# Patient Record
Sex: Male | Born: 2000 | Race: Black or African American | Hispanic: No | Marital: Single
Health system: Southern US, Community
[De-identification: ages and names within clinical notes are randomized; demographics above are authoritative.]

## PROBLEM LIST (undated history)

## (undated) DIAGNOSIS — F909 Attention-deficit hyperactivity disorder, unspecified type: Secondary | ICD-10-CM

## (undated) DIAGNOSIS — F840 Autistic disorder: Secondary | ICD-10-CM

---
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
## 2022-02-28 ENCOUNTER — Ambulatory Visit (INDEPENDENT_AMBULATORY_CARE_PROVIDER_SITE_OTHER): Payer: Medicaid Other | Admitting: Podiatry

## 2022-02-28 DIAGNOSIS — L600 Ingrowing nail: Secondary | ICD-10-CM

## 2022-02-28 MED ORDER — NEOMYCIN-POLYMYXIN-HC 1 % OT SOLN: OTIC | 0 refills | Status: AC

## 2022-02-28 NOTE — Patient Instructions (Signed)

## 2022-03-04 ENCOUNTER — Encounter: Payer: Self-pay | Admitting: Podiatry

## 2022-03-04 NOTE — Progress Notes (Signed)
?  Subjective:  ?Patient ID: Tom Foster, male    DOB: 06-09-2001,  MRN: [id number] ? ?Chief Complaint  ?Patient presents with  ? Ingrown Toenail  ?  Right great  ? ? ?21 y.o. male presents with the above complaint. History confirmed with patient.  Here with his mother who confirms a history ? ?Objective:  ?Physical Exam: ?warm, good capillary refill, no trophic changes or ulcerative lesions, normal DP and PT pulses, normal sensory exam, and ingrown right great toenail. ? ?Assessment:  ? ?1. Ingrowing right great toenail   ? ? ? ?Plan:  ?Patient was evaluated and treated and all questions answered. ? ? ? ?Ingrown Nail, right ?-Patient elects to proceed with minor surgery to remove ingrown toenail today. Consent reviewed and signed by patient. ?-Ingrown nail excised. See procedure note. ?-Educated on post-procedure care including soaking. Written instructions provided and reviewed. ?-Patient to follow up in 2 weeks for nail check. ? ?Procedure: Excision of Ingrown Toenail ?Location: Right 1st toe  nail borders. ?Anesthesia: Lidocaine 1% plain; 1.5 mL and Marcaine 0.5% plain; 1.5 mL, digital block. ?Skin Prep: Betadine. ?Dressing: Silvadene; telfa; dry, sterile, compression dressing. ?Technique: Following skin prep, the toe was exsanguinated and a tourniquet was secured at the base of the toe. The affected nail border was freed, split with a nail splitter, and excised. Chemical matrixectomy was then performed with phenol and irrigated out with alcohol. The tourniquet was then removed and sterile dressing applied. ?Disposition: Patient tolerated procedure well. Patient to return in 2 weeks for follow-up.  ? ? ?Return in about 2 weeks (around 03/14/2022) for nail re-check.  ? ?

## 2022-03-21 ENCOUNTER — Encounter: Payer: Self-pay | Admitting: Podiatry

## 2022-03-21 ENCOUNTER — Ambulatory Visit (INDEPENDENT_AMBULATORY_CARE_PROVIDER_SITE_OTHER): Payer: Medicaid Other | Admitting: Podiatry

## 2022-03-21 DIAGNOSIS — L600 Ingrowing nail: Secondary | ICD-10-CM

## 2022-03-26 ENCOUNTER — Encounter: Payer: Self-pay | Admitting: Podiatry

## 2022-03-26 NOTE — Progress Notes (Signed)
?  Subjective:  ?Patient ID: Tom Foster, male    DOB: 11-Aug-2001,  MRN: [id number] ? ?Chief Complaint  ?Patient presents with  ? Ingrown Toenail  ?   2 WEEK NAIL CHECK right- no pain to toe, no complaints at this time. Patient soaking foot daily.   ? ? ?21 y.o. male presents with the above complaint. History confirmed with patient.  Doing well not having much pain at all. ? ?Objective:  ?Physical Exam: ?warm, good capillary refill, no trophic changes or ulcerative lesions, normal DP and PT pulses, normal sensory exam, and right great toe matricectomy site is healing well ? ?Assessment:  ? ?1. Ingrowing right great toenail   ? ? ? ?Plan:  ?Patient was evaluated and treated and all questions answered. ? ? ? ?Ingrown Nail, right ?-He is doing very well, no issues minimal drainage.  Matricectomy site is healing well.  Advised to leave open air and discontinue soaks and ointment. ? ? ?Return if symptoms worsen or fail to improve.  ? ?
